# Patient Record
Sex: Male | Born: 1960 | Race: White | Hispanic: No | Marital: Married | Smoking: Never smoker
Health system: Southern US, Community
[De-identification: ages and names within clinical notes are randomized; demographics above are authoritative.]

---

## 2008-12-10 ENCOUNTER — Ambulatory Visit: Payer: Self-pay | Admitting: Gastroenterology

## 2012-06-21 ENCOUNTER — Emergency Department: Payer: Self-pay | Admitting: Emergency Medicine

## 2012-06-21 LAB — URINALYSIS, COMPLETE
Bacteria: NONE SEEN
Ketone: NEGATIVE
Leukocyte Esterase: NEGATIVE
Nitrite: NEGATIVE
Ph: 5 (ref 4.5–8.0)
Protein: NEGATIVE
Squamous Epithelial: NONE SEEN
WBC UR: 1 /HPF (ref 0–5)

## 2012-06-21 LAB — COMPREHENSIVE METABOLIC PANEL
Anion Gap: 9 (ref 7–16)
BUN: 25 mg/dL — ABNORMAL HIGH (ref 7–18)
Bilirubin,Total: 0.4 mg/dL (ref 0.2–1.0)
Calcium, Total: 9.1 mg/dL (ref 8.5–10.1)
Chloride: 103 mmol/L (ref 98–107)
Co2: 24 mmol/L (ref 21–32)
EGFR (African American): 52 — ABNORMAL LOW
Glucose: 147 mg/dL — ABNORMAL HIGH (ref 65–99)
Osmolality: 279 (ref 275–301)
Potassium: 4.7 mmol/L (ref 3.5–5.1)
SGOT(AST): 98 U/L — ABNORMAL HIGH (ref 15–37)
SGPT (ALT): 48 U/L (ref 12–78)

## 2012-06-21 LAB — CBC
HCT: 42 % (ref 40.0–52.0)
HGB: 14.4 g/dL (ref 13.0–18.0)
MCH: 30.7 pg (ref 26.0–34.0)
MCHC: 34.3 g/dL (ref 32.0–36.0)
MCV: 90 fL (ref 80–100)
Platelet: 194 10*3/uL (ref 150–440)
RBC: 4.69 10*6/uL (ref 4.40–5.90)
RDW: 13.6 % (ref 11.5–14.5)

## 2012-06-25 ENCOUNTER — Ambulatory Visit: Payer: Self-pay | Admitting: Urology

## 2012-06-25 LAB — BASIC METABOLIC PANEL
Anion Gap: 7 (ref 7–16)
Calcium, Total: 9.4 mg/dL (ref 8.5–10.1)
Chloride: 98 mmol/L (ref 98–107)
Co2: 29 mmol/L (ref 21–32)
Creatinine: 2.28 mg/dL — ABNORMAL HIGH (ref 0.60–1.30)
Glucose: 100 mg/dL — ABNORMAL HIGH (ref 65–99)
Osmolality: 276 (ref 275–301)
Potassium: 4.4 mmol/L (ref 3.5–5.1)
Sodium: 134 mmol/L — ABNORMAL LOW (ref 136–145)

## 2012-06-26 ENCOUNTER — Ambulatory Visit: Payer: Self-pay | Admitting: Urology

## 2012-06-26 LAB — BASIC METABOLIC PANEL
Anion Gap: 6 — ABNORMAL LOW (ref 7–16)
Calcium, Total: 9.2 mg/dL (ref 8.5–10.1)
Chloride: 102 mmol/L (ref 98–107)
Creatinine: 1.54 mg/dL — ABNORMAL HIGH (ref 0.60–1.30)
EGFR (Non-African Amer.): 51 — ABNORMAL LOW
Osmolality: 283 (ref 275–301)
Potassium: 4.6 mmol/L (ref 3.5–5.1)
Sodium: 138 mmol/L (ref 136–145)

## 2014-01-23 IMAGING — CR DG ABDOMEN 1V
1 series · 2 of 2 positions shown · non-contrast
Comparison: none

REASON FOR EXAM: Calculus
COMMENTS:

PROCEDURE:     DXR - DXR KIDNEY URETER BLADDER  - June 26, 2012  [DATE]
RESULT:     Soft tissue structures are unremarkable. Gas pattern is
nonspecific. Calcifications in pelvis most consistent with phleboliths.

[Series 1: supine kub · 0.17mm/px · 2 of 2 slices shown]
[im 1/2]
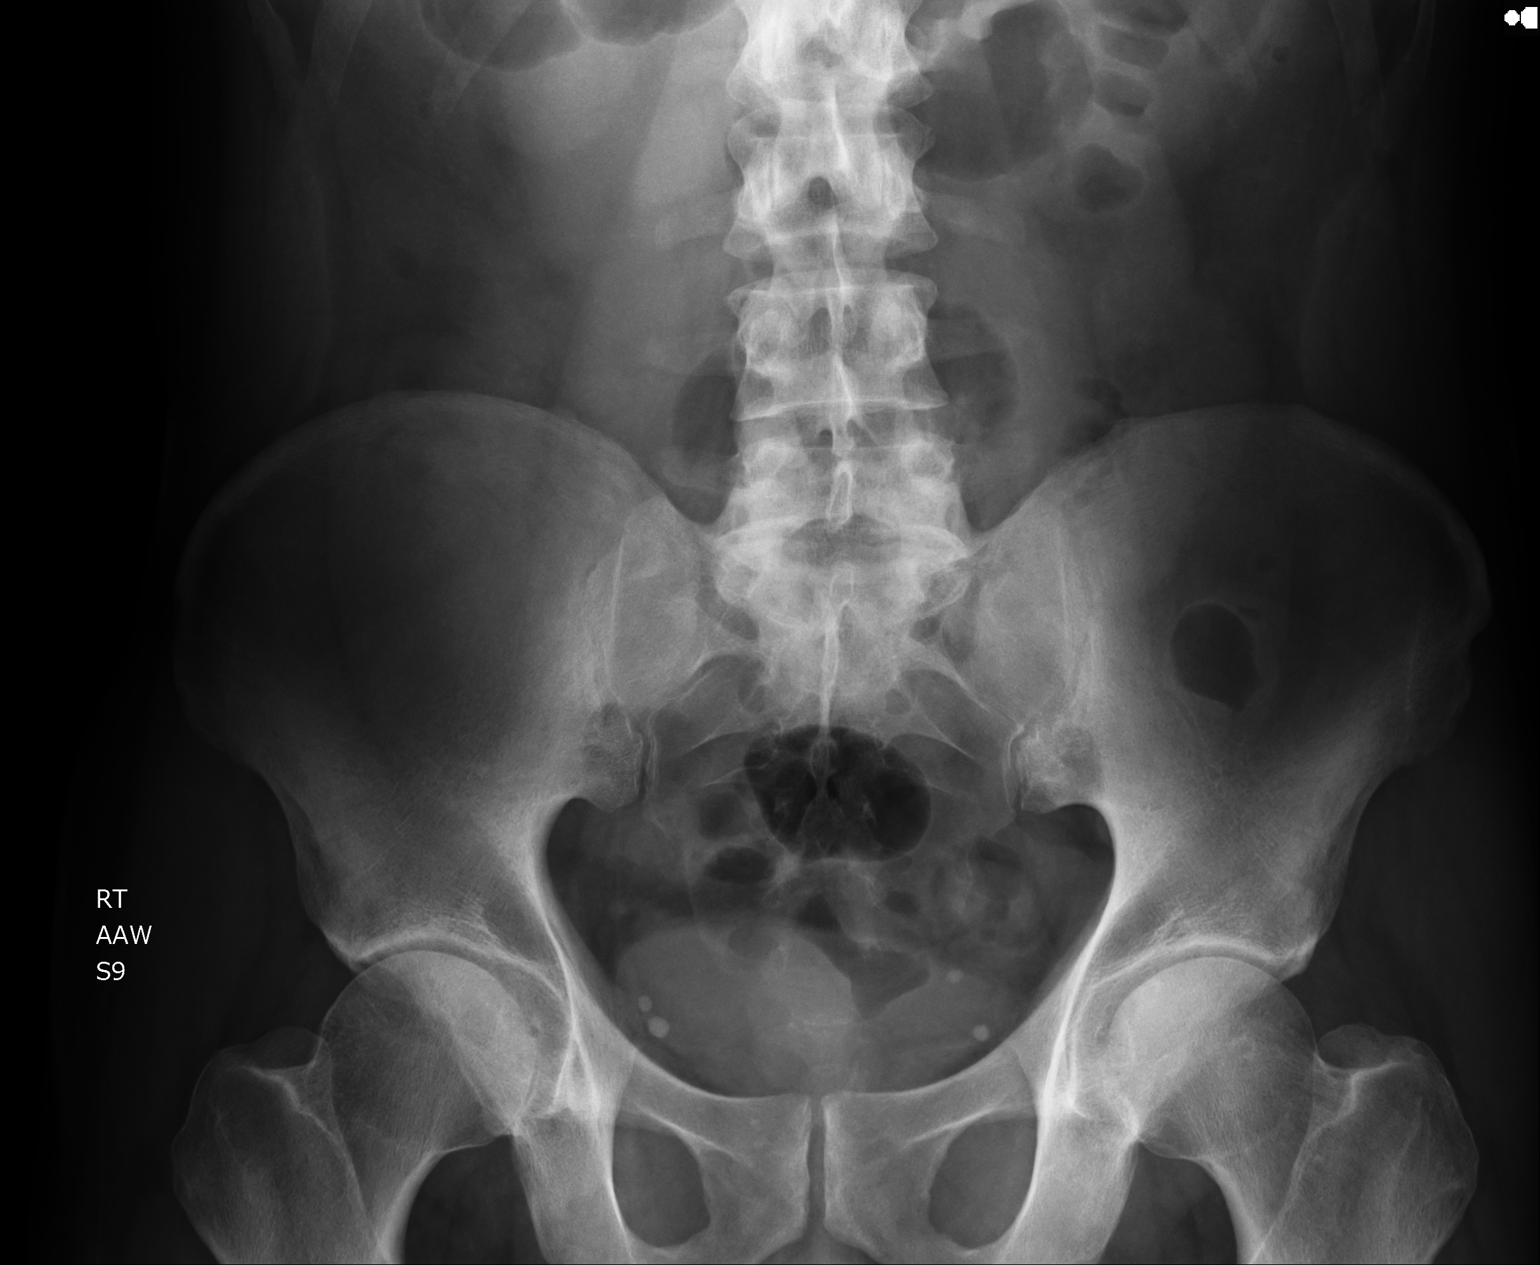
[im 2/2]
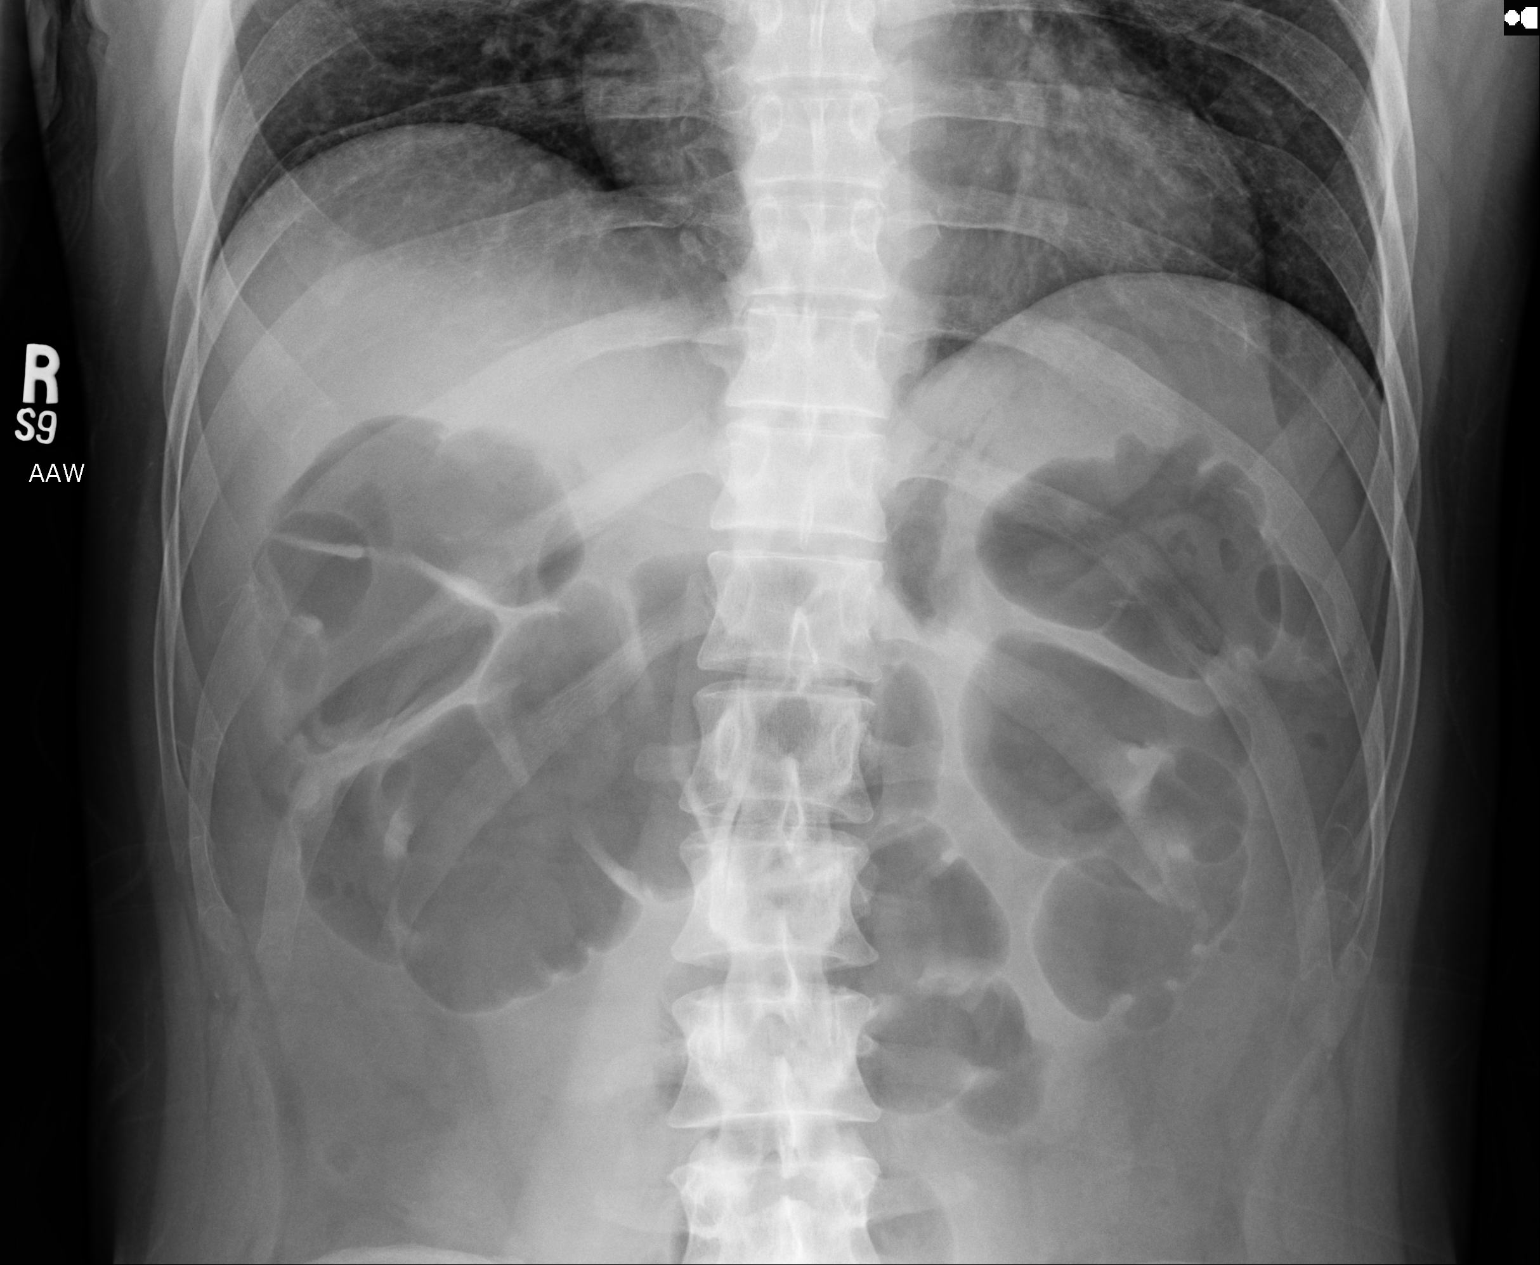

[2 of 2 positions shown; findings below may reference images not displayed]

IMPRESSION: Nonspecific exam.

## 2017-04-08 ENCOUNTER — Encounter: Payer: Self-pay | Admitting: Emergency Medicine

## 2017-04-08 ENCOUNTER — Emergency Department: Payer: Managed Care, Other (non HMO)

## 2017-04-08 ENCOUNTER — Emergency Department
Admission: EM | Admit: 2017-04-08 | Discharge: 2017-04-08 | Disposition: A | Discharge status: Home / Self Care | Payer: Managed Care, Other (non HMO) | Attending: Emergency Medicine | Admitting: Emergency Medicine

## 2017-04-08 DIAGNOSIS — R0602 Shortness of breath: Secondary | ICD-10-CM | POA: Diagnosis not present

## 2017-04-08 LAB — CBC
HCT: 45.2 % (ref 40.0–52.0)
Hemoglobin: 15.3 g/dL (ref 13.0–18.0)
MCH: 30.2 pg (ref 26.0–34.0)
MCHC: 33.9 g/dL (ref 32.0–36.0)
MCV: 89.1 fL (ref 80.0–100.0)
PLATELETS: 195 10*3/uL (ref 150–440)
RBC: 5.07 MIL/uL (ref 4.40–5.90)
RDW: 13.6 % (ref 11.5–14.5)
WBC: 6.5 10*3/uL (ref 3.8–10.6)

## 2017-04-08 LAB — BASIC METABOLIC PANEL
Anion gap: 10 (ref 5–15)
BUN: 24 mg/dL — AB (ref 6–20)
CALCIUM: 9 mg/dL (ref 8.9–10.3)
CO2: 27 mmol/L (ref 22–32)
CREATININE: 1.2 mg/dL (ref 0.61–1.24)
Chloride: 101 mmol/L (ref 101–111)
GFR calc non Af Amer: >60 mL/min (ref >60)
Glucose, Bld: 122 mg/dL — ABNORMAL HIGH (ref 65–99)
Potassium: 3.3 mmol/L — ABNORMAL LOW (ref 3.5–5.1)
SODIUM: 138 mmol/L (ref 135–145)

## 2017-04-08 LAB — TROPONIN I

## 2017-04-08 NOTE — ED Provider Notes (Signed)
[facility] Emergency Department Provider Note   ____________________________________________   I have reviewed the triage vital signs and the nursing notes.   HISTORY  Chief Complaint Shortness of Breath   History limited by: Not Limited   HPI Matthew Griffith is a 56 y.o. male who presents to the emergency department today after episode of difficulty with breathing.  The patient felt like the breathing difficulty was stemming from his throat.  It started somewhat suddenly today.  It started after he ate multiple chocolate covered coffee beans.  He felt like he could breathe out okay but could not breathe in.  He did feel like there was 1 minute of very severe shortness of breath and then it started improving.  However it did return slightly 10 minutes later.  He did take 25 mg of Benadryl.  He denies any associated itchiness or hives.  Did have one brief episode of vomiting when he was having the most difficulty with breathing.  Otherwise no nausea.  Patient denies any known allergies to any food.  He does drink coffee and eat chocolate without issues.   Per medical record review patient with no significant history.   History reviewed. No pertinent past medical history.  There are no active problems to display for this patient.   History reviewed. No pertinent surgical history.  Prior to Admission medications   Not on File    Allergies Patient has no known allergies.  History reviewed. No pertinent family history.  Social History Social History   Tobacco Use  . Smoking status: Never Smoker  . Smokeless tobacco: Never Used  Substance Use Topics  . Alcohol use: Not on file  . Drug use: No    Review of Systems Constitutional: No fever/chills Eyes: No visual changes. ENT: Felt like throat was tight. Cardiovascular: Denies chest pain. Respiratory: Positive for shortness of breath. Gastrointestinal: No abdominal pain.  No nausea, no vomiting.   No diarrhea.   Genitourinary: Negative for dysuria. Musculoskeletal: Negative for back pain. Skin: Negative for rash. Neurological: Negative for headaches, focal weakness or numbness.  ____________________________________________   PHYSICAL EXAM:  VITAL SIGNS: ED Triage Vitals [04/08/17 2041]  Enc Vitals Group     BP (!) 172/81     Pulse Rate (!) 118     Resp 17     Temp 99 F (37.2 C)     Temp Source Oral     SpO2 99 %     Weight 185 lb (83.9 kg)     Height 6\' 2"  (1.88 m)    Constitutional: Alert and oriented. Well appearing and in no distress. Eyes: Conjunctivae are normal.  ENT   Head: Normocephalic and atraumatic.   Nose: No congestion/rhinnorhea.   Mouth/Throat: Mucous membranes are moist.   Neck: No stridor. Hematological/Lymphatic/Immunilogical: No cervical lymphadenopathy. Cardiovascular: Normal rate, regular rhythm.  No murmurs, rubs, or gallops.  Respiratory: Normal respiratory effort without tachypnea nor retractions. Breath sounds are clear and equal bilaterally. No wheezes/rales/rhonchi. Gastrointestinal: Soft and non tender. No rebound. No guarding.  Genitourinary: Deferred Musculoskeletal: Normal range of motion in all extremities. No lower extremity edema. Neurologic:  Normal speech and language. No gross focal neurologic deficits are appreciated.  Skin:  Skin is warm, dry and intact. No rash noted. Psychiatric: Mood and affect are normal. Speech and behavior are normal. Patient exhibits appropriate insight and judgment.  ____________________________________________    LABS (pertinent positives/negatives)  Trop <0.03 CBC wnl BMP k 3.3, glu 122  ____________________________________________   EKG  None  ____________________________________________    RADIOLOGY  CXR No acute disease  ____________________________________________   PROCEDURES  Procedures  ____________________________________________   INITIAL  IMPRESSION / ASSESSMENT AND PLAN / ED COURSE  Pertinent labs & imaging results that were available during my care of the patient were reviewed by me and considered in my medical decision making (see chart for details).  Patient presented to the emergency department today after difficulty with breathing.  This was a very short episode.  No other signs of allergic reaction.  Patient without any known allergies to chocolate or coffee or any other food.  Patient did feel better at the time of my exam.  This point do not feel he would benefit from further observation in the emergency department.  Did discuss return precautions.    ____________________________________________   FINAL CLINICAL IMPRESSION(S) / ED DIAGNOSES  Final diagnoses:  SOB (shortness of breath)     Note: This dictation was prepared with Dragon dictation. Any transcriptional errors that result from this process are unintentional     Phineas SemenGoodman, Yahya Boldman, MD 04/08/17 [phone]

## 2017-04-08 NOTE — ED Triage Notes (Signed)
Pt reports he consumed a handful of chocolate covered coffee beans and afterwards had episode where he could not breath and felt as if his throat was closing. Pt jittery in triage and dry cough is present. Airway clear on assessment. O2 is 99% RA.

## 2017-04-08 NOTE — Discharge Instructions (Signed)
Please seek medical attention for any high fevers, chest pain, shortness of breath, change in behavior, persistent vomiting, bloody stool or any other new or concerning symptoms.  

## 2017-04-08 NOTE — ED Notes (Signed)
Pt states he had an episode tonight where he felt like he couldn't breath in. States he got dizzy and vomited x 1. States he could breath out, just not in. Pt had covered coffee beans and thinks they irritated throat which began this. C/o of throat irritation at this time. Denies CP and SOB. Denies diaphoresis. Alert, oriented, speaking in complete sentences. No distress noted at this time.

## 2017-04-08 NOTE — ED Notes (Signed)
Pt had Benadryl 25mg  at 2000

## 2017-11-12 ENCOUNTER — Other Ambulatory Visit: Payer: Self-pay | Admitting: Unknown Physician Specialty

## 2017-11-12 DIAGNOSIS — R42 Dizziness and giddiness: Secondary | ICD-10-CM

## 2017-11-17 ENCOUNTER — Ambulatory Visit
Admission: RE | Admit: 2017-11-17 | Discharge: 2017-11-17 | Disposition: A | Discharge status: Home / Self Care | Payer: Managed Care, Other (non HMO) | Source: Ambulatory Visit | Attending: Unknown Physician Specialty | Admitting: Unknown Physician Specialty

## 2017-11-17 DIAGNOSIS — R42 Dizziness and giddiness: Secondary | ICD-10-CM

## 2017-11-17 MED ORDER — GADOBENATE DIMEGLUMINE 529 MG/ML IV SOLN
15.0000 mL | Freq: Once | INTRAVENOUS | Status: AC | PRN
  Administered 2017-11-17: 15 mL via INTRAVENOUS

## 2019-06-16 IMAGING — MR MR BRAIN/IAC WO/W
13 series · 48 of 48 positions shown · IV contrast (15 MULTIHANCE)
Comparison: None.

CLINICAL DATA: Dizziness with high-pitched tinnitus in both ears.

EXAM:
MRI HEAD WITHOUT AND WITH CONTRAST
TECHNIQUE: Multiplanar, multiecho pulse sequences of the brain and surrounding
structures were obtained without and with intravenous contrast.
CONTRAST:  15mL MULTIHANCE GADOBENATE DIMEGLUMINE 529 MG/ML IV SOLN

[Series 2: T1 · sagittal · 5.0mm · 0.90mm/px · 2 of 24 slices shown (1 of 5)]
[im 1/24]
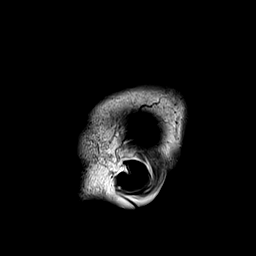
[im 24/24]
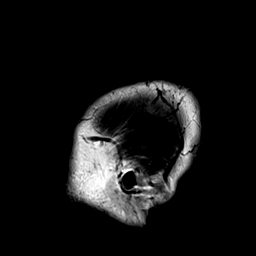

[Series 3: DWI · axial · 5.0mm · 1.80mm/px · z∈[-56,+100]mm · 11 of 100 slices shown (1 of 3)]
[im 1/100]
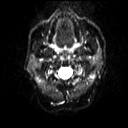
[im 10/100]
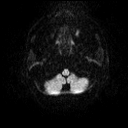
[im 20/100]
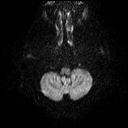
[im 30/100]
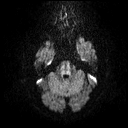
[im 40/100]
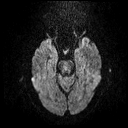
[im 50/100]
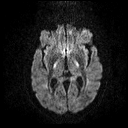
[im 60/100]
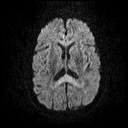
[im 70/100]
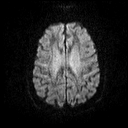
[im 80/100]
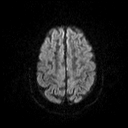
[im 90/100]
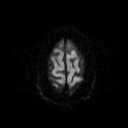
[im 100/100]
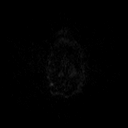

[Series 4: DWI · axial · 5.0mm · 1.80mm/px · z∈[-56,+100]mm · 5 of 50 slices shown (2 of 3)]
[im 1/50]
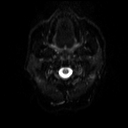
[im 13/50]
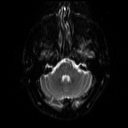
[im 25/50]
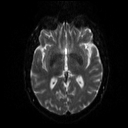
[im 37/50]
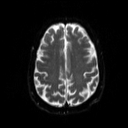
[im 50/50]
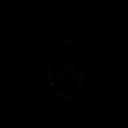

[Series 5: DWI · axial · 5.0mm · 1.80mm/px · z∈[-56,+100]mm · 3 of 25 slices shown (3 of 3)]
[im 1/25]
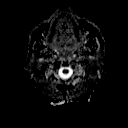
[im 13/25]
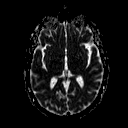
[im 25/25]
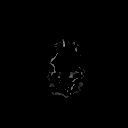

[Series 6: T2 · axial · 5.0mm · 0.51mm/px · z∈[-61,+101]mm · 3 of 28 slices shown (1 of 3)]
[im 1/28]
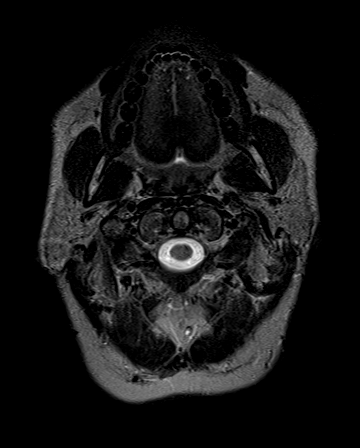
[im 14/28]
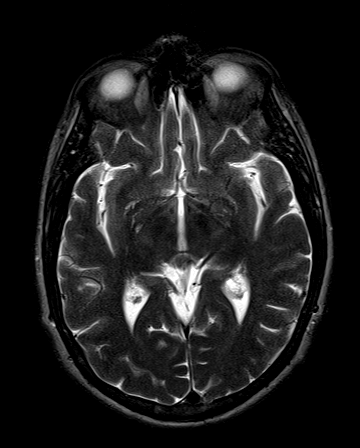
[im 28/28]
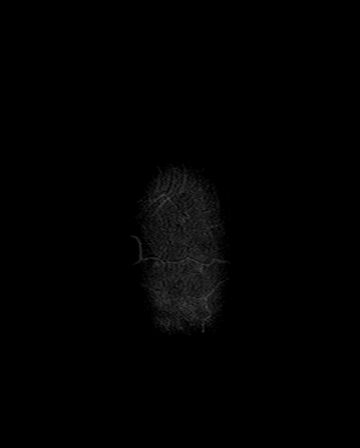

[Series 7: FLAIR · axial · 5.0mm · 0.90mm/px · z∈[-61,+101]mm · 3 of 28 slices shown]
[im 1/28]
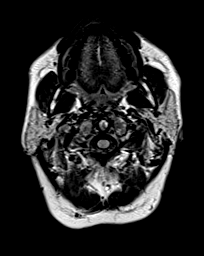
[im 14/28]
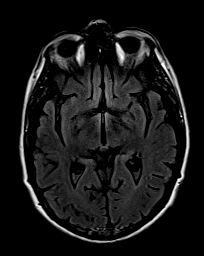
[im 28/28]
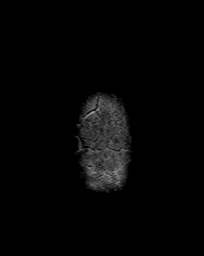

[Series 8: T2 · axial · 5.0mm · 0.43mm/px · z∈[-61,+101]mm · 3 of 28 slices shown (2 of 3)]
[im 1/28]
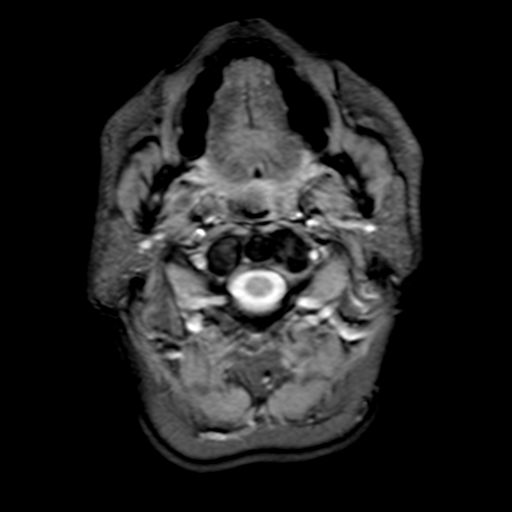
[im 14/28]
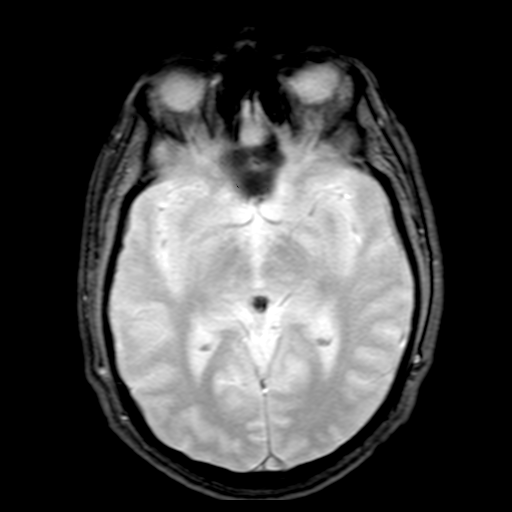
[im 28/28]
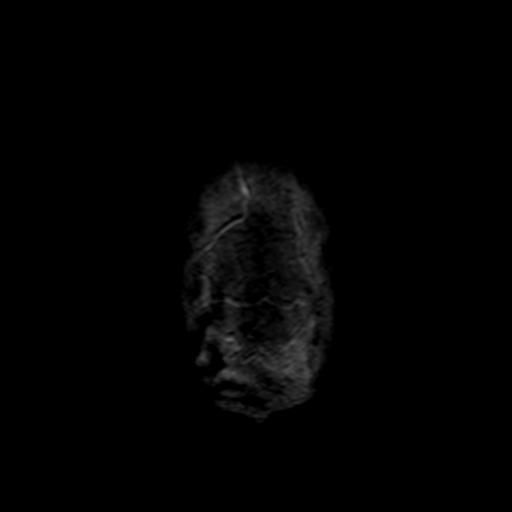

[Series 9: T1 · coronal · 3.0mm · 0.62mm/px · 1 of 14 slices shown (2 of 5)]
[im 1/14]
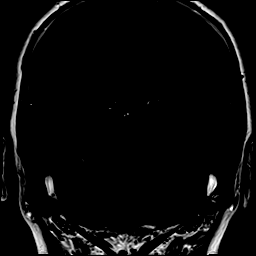

[Series 10: T2 · axial · 1.0mm · 0.35mm/px · z∈[-41,-6]mm · 4 of 36 slices shown (3 of 3)]
[im 1/36]
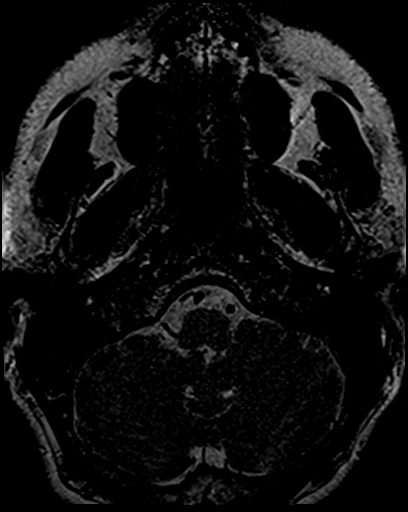
[im 12/36]
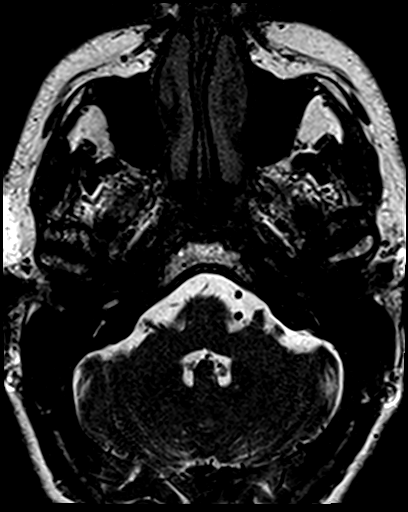
[im 24/36]
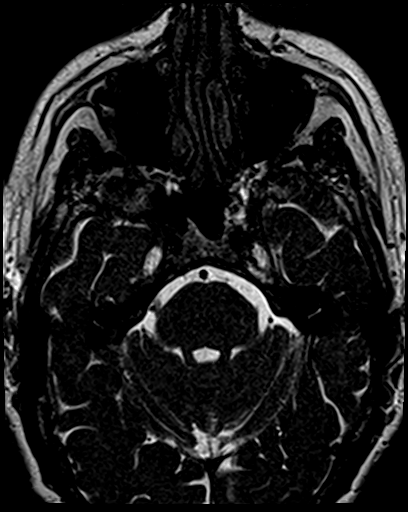
[im 36/36]
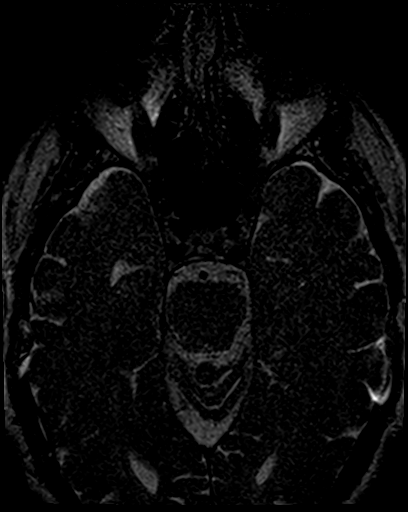

[Series 11: T1 · axial · 3.0mm · 0.62mm/px · z∈[-46,+1]mm · 2 of 15 slices shown (3 of 5)]
[im 1/15]
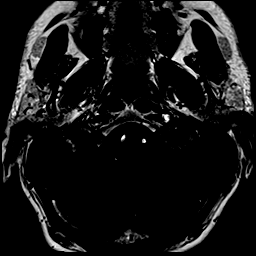
[im 15/15]
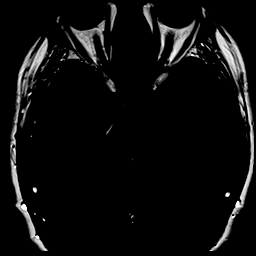

[Series 12: T1 · axial · 3.0mm · 0.31mm/px · z∈[-46,+1]mm · 2 of 15 slices shown (4 of 5)]
[im 1/15]
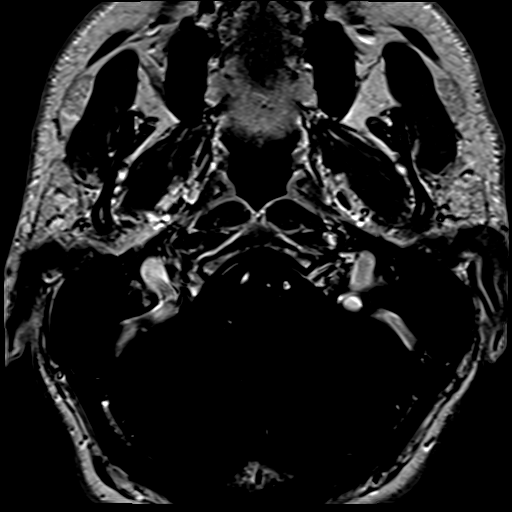
[im 15/15]
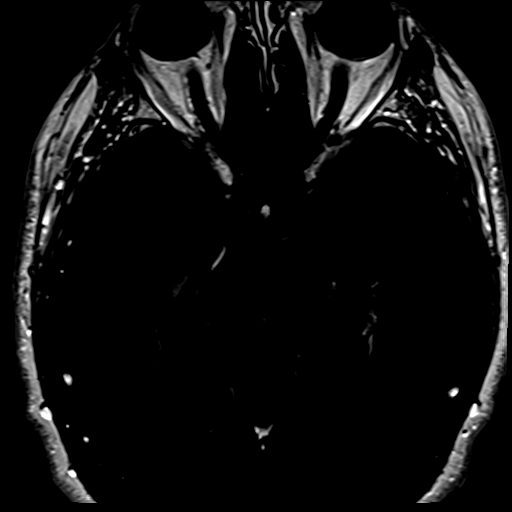

[Series 13: T1 · coronal · 3.0mm · 0.31mm/px · 1 of 14 slices shown (5 of 5)]
[im 1/14]
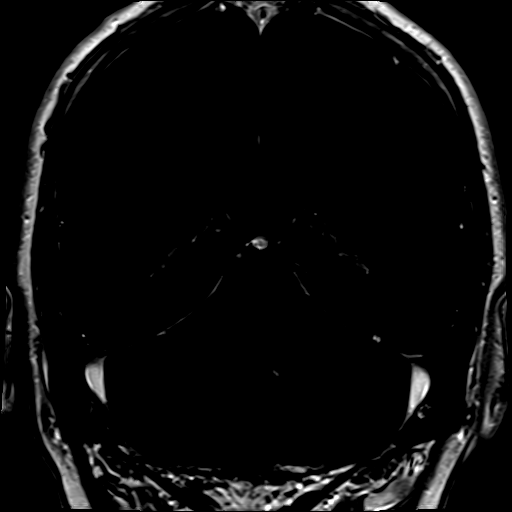

[Series 14: t1_3d_tra +c · axial · 2.0mm · 0.94mm/px · z∈[-59,+98]mm · 8 of 80 slices shown]
[im 1/80]
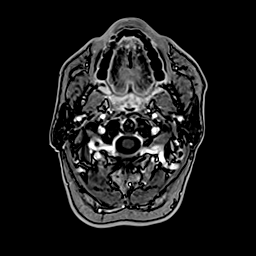
[im 12/80]
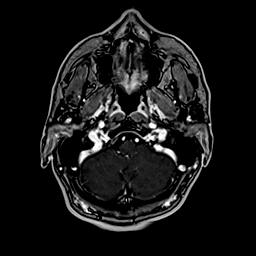
[im 23/80]
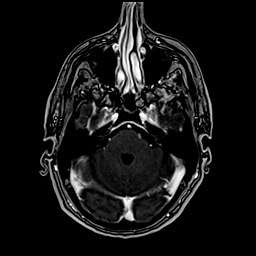
[im 34/80]
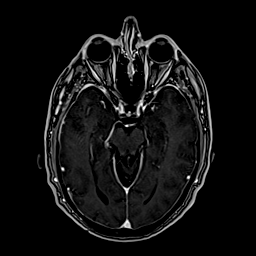
[im 46/80]
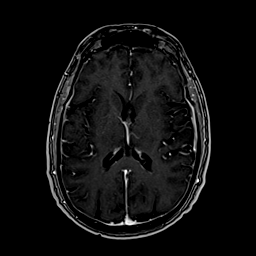
[im 57/80]
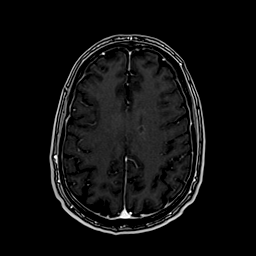
[im 68/80]
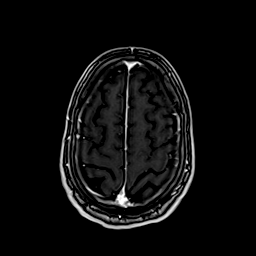
[im 80/80]
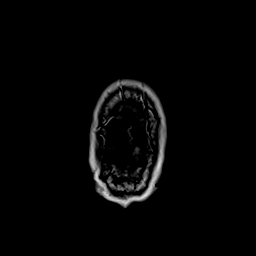

[48 of 48 positions shown; findings below may reference images not displayed]

FINDINGS: Brain: Normal appearance of the temporal bones. There is symmetric
normal labyrinthine signal. Normal appearance of the
vestibulocochlear nerves, brainstem, and cisterns. Unremarkable and
symmetric sigmoid and transverse dural venous sinuses without
evident diverticulum. Unremarkable course of the internal carotid
arteries without apparent stenosis.

No infarct, hemorrhage, hydrocephalus, or mass.

Vascular: Major flow voids and vascular enhancements are preserved

Skull and upper cervical spine: No evidence of marrow lesion

Sinuses/Orbits: Negative
IMPRESSION: Normal exam.
